# Patient Record
Sex: Male | Born: 1990 | Hispanic: Yes | Marital: Single | Smoking: Never smoker
Health system: Southern US, Community
[De-identification: ages and names within clinical notes are randomized; demographics above are authoritative.]

---

## 2014-06-11 ENCOUNTER — Encounter (HOSPITAL_COMMUNITY): Payer: Self-pay | Admitting: Emergency Medicine

## 2014-06-11 ENCOUNTER — Emergency Department (INDEPENDENT_AMBULATORY_CARE_PROVIDER_SITE_OTHER)
Admission: EM | Admit: 2014-06-11 | Discharge: 2014-06-11 | Disposition: A | Discharge status: Home / Self Care | Payer: Self-pay | Source: Home / Self Care | Attending: Family Medicine | Admitting: Family Medicine

## 2014-06-11 DIAGNOSIS — E860 Dehydration: Secondary | ICD-10-CM

## 2014-06-11 LAB — POCT I-STAT, CHEM 8
BUN: 10 mg/dL (ref 6–23)
CREATININE: 1.2 mg/dL (ref 0.50–1.35)
Calcium, Ion: 1.19 mmol/L (ref 1.12–1.23)
Chloride: 105 mEq/L (ref 96–112)
Glucose, Bld: 92 mg/dL (ref 70–99)
HEMATOCRIT: 50 % (ref 39.0–52.0)
Hemoglobin: 17 g/dL (ref 13.0–17.0)
Potassium: 4.1 mEq/L (ref 3.7–5.3)
SODIUM: 138 meq/L (ref 137–147)
TCO2: 29 mmol/L (ref 0–100)

## 2014-06-11 MED ORDER — ONDANSETRON HCL 4 MG PO TABS: 4.0000 mg | ORAL_TABLET | Freq: Four times a day (QID) | ORAL | Status: AC

## 2014-06-11 NOTE — ED Provider Notes (Signed)
CSN: [id number]     Arrival date & time 06/11/14  1222 History   First MD Initiated Contact with Patient 06/11/14 1225     Chief Complaint  Patient presents with  . Headache   (Consider location/radiation/quality/duration/timing/severity/associated sxs/prior Treatment) Patient is a 23 y.o. male presenting with headaches. The history is provided by the patient.  Headache Pain location:  Frontal Radiates to:  Does not radiate Onset quality:  Gradual Duration:  2 days Progression:  Unchanged Chronicity:  New Similar to prior headaches: no   Context: emotional stress   Context comment:  Works outside as Designer, fashion/clothing in heat Associated symptoms: vomiting   Associated symptoms: no diarrhea, no fever, no nausea, no neck pain and no neck stiffness     History reviewed. No pertinent past medical history. History reviewed. No pertinent past surgical history. History reviewed. No pertinent family history. History  Substance Use Topics  . Smoking status: Never Smoker   . Smokeless tobacco: Not on file  . Alcohol Use: No    Review of Systems  Constitutional: Negative.  Negative for fever.  Gastrointestinal: Positive for vomiting. Negative for nausea, diarrhea and constipation.  Musculoskeletal: Negative for neck pain and neck stiffness.  Skin: Negative for rash.  Neurological: Positive for headaches.    Allergies  Review of patient's allergies indicates no known allergies.  Home Medications   Prior to Admission medications   Medication Sig Start Date End Date Taking? Authorizing Provider  ondansetron (ZOFRAN) 4 MG tablet Take 1 tablet (4 mg total) by mouth every 6 (six) hours. Prn n/v. 06/11/14   Linna Hoff, MD   BP 134/87  Pulse 87  Temp(Src) 97.8 F (36.6 C) (Oral)  Resp 12  SpO2 100% Physical Exam  Nursing note and vitals reviewed. Constitutional: He is oriented to person, place, and time. He appears well-developed and well-nourished.  HENT:  Head: Normocephalic.   Mouth/Throat: Oropharynx is clear and moist.  Neck: Normal range of motion. Neck supple.  Cardiovascular: Normal heart sounds and intact distal pulses.   Pulmonary/Chest: Effort normal and breath sounds normal.  Abdominal: Soft. Bowel sounds are normal. There is no tenderness.  Musculoskeletal: Normal range of motion.  Lymphadenopathy:    He has no cervical adenopathy.  Neurological: He is alert and oriented to person, place, and time.  Skin: Skin is warm and dry. No rash noted.    ED Course  Procedures (including critical care time) Labs Review Labs Reviewed - No data to display i-stat--wnl. Imaging Review No results found.   MDM   1. Dehydration, mild        Linna Hoff, MD 06/11/14 1346

## 2014-06-11 NOTE — ED Notes (Signed)
Pt     Reports   Symptoms  Of  Headache    And      Vomited  X  1        With  Onset       Today   Reports  Some  Body  Aches  As  Well          denys  Any  Diarrhea            He  Is  Sitting  Upright on  The exam  Table  Reports   Some  dizzyness   As  well

## 2015-08-11 ENCOUNTER — Emergency Department (HOSPITAL_COMMUNITY): Admission: EM | Admit: 2015-08-11 | Discharge: 2015-08-11 | Discharge status: Absent without leave | Payer: Self-pay

## 2015-09-30 ENCOUNTER — Emergency Department (HOSPITAL_COMMUNITY): Payer: Self-pay

## 2015-09-30 ENCOUNTER — Emergency Department (HOSPITAL_COMMUNITY)
Admission: EM | Admit: 2015-09-30 | Discharge: 2015-09-30 | Disposition: A | Discharge status: Home / Self Care | Payer: Self-pay | Attending: Emergency Medicine | Admitting: Emergency Medicine

## 2015-09-30 ENCOUNTER — Encounter (HOSPITAL_COMMUNITY): Payer: Self-pay | Admitting: Emergency Medicine

## 2015-09-30 DIAGNOSIS — Y9289 Other specified places as the place of occurrence of the external cause: Secondary | ICD-10-CM | POA: Insufficient documentation

## 2015-09-30 DIAGNOSIS — S92102A Unspecified fracture of left talus, initial encounter for closed fracture: Secondary | ICD-10-CM

## 2015-09-30 DIAGNOSIS — Y998 Other external cause status: Secondary | ICD-10-CM | POA: Insufficient documentation

## 2015-09-30 DIAGNOSIS — S92125A Nondisplaced fracture of body of left talus, initial encounter for closed fracture: Secondary | ICD-10-CM | POA: Insufficient documentation

## 2015-09-30 DIAGNOSIS — Y9389 Activity, other specified: Secondary | ICD-10-CM | POA: Insufficient documentation

## 2015-09-30 MED ORDER — HYDROCODONE-ACETAMINOPHEN 5-325 MG PO TABS
1.0000 | ORAL_TABLET | Freq: Once | ORAL | Status: AC
  Administered 2015-09-30: 1 via ORAL
  Filled 2015-09-30: qty 1

## 2015-09-30 MED ORDER — IBUPROFEN 600 MG PO TABS: 600.0000 mg | ORAL_TABLET | Freq: Four times a day (QID) | ORAL | Status: AC | PRN

## 2015-09-30 MED ORDER — HYDROCODONE-ACETAMINOPHEN 5-325 MG PO TABS: 1.0000 | ORAL_TABLET | ORAL | Status: AC | PRN
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
## 2015-09-30 NOTE — Discharge Instructions (Signed)
Cuidados del yeso o la férula °(Cast or Splint Care) °El yeso y las férulas sostienen los miembros lesionados y evitan que los huesos se muevan hasta que se curen. Es importante que cuide el yeso o la férula cuando se encuentre en su casa.   °INSTRUCCIONES PARA EL CUIDADO EN EL HOGAR °· Mantenga el yeso o la férula al descubierto durante el tiempo de secado. Puede tardar entre 24 y 48 horas para secarse si está hecho de yeso. La fibra de vidrio se seca en menos de 1 hora. °· No apoye el yeso sobre nada que sea más duro que una almohada durante 24 horas. °· No aplique peso sobre el miembro lesionado ni haga presión sobre el yeso hasta que el médico lo autorice. °· Mantenga el yeso o la férula secos. Al mojarse pueden perder la forma y podría ocurrir que no soporten el miembro. Un yeso mojado que ha perdido su forma puede presionar de manera peligrosa en la piel al secarse. Además, la piel mojada podría infectarse. °· Cubra el yeso o la férula con una bolsa plástica cuando tome un baño o cuando salga al exterior en días de lluvia o nieve. Si el yeso está colocado sobre el tronco, deberá bañarse pasando una esponja por el cuerpo, hasta que se lo retiren. °· Si el yeso se moja, séquelo con una toalla o con un secador de cabello sólo en posición de aire frío. °· Mantenga el yeso o la férula limpios. Si el yeso se ensucia, puede limpiarlo con un paño húmedo. °· No coloque objetos extraños duros o blandos debajo del yeso o cabestrillo, como algodón, papel higiénico, loción o talco. °· No se rasque la piel por debajo del molde con ningún objeto. Podría quedar adherido al yeso. Además, el rascado puede causar una infección. Si siente picazón, use un secador de cabello con aire frío sobre la zona que pica para aliviar las molestias. °· No recorte ni quite el relleno acolchado que se encuentra debajo del yeso. °· Ejercite todas las articulaciones que no estén inmovilizadas por el yeso o férula. Por ejemplo, si tiene un yeso  largo de pierna, ejercite la articulación de la cadera y los dedos de los pies. Si tiene un brazo enyesado o entablillado, ejercite el hombro, el codo, el pulgar y los dedos de la mano. °· Eleve el brazo o la pierna sobre 1 ó 2 almohadas durante los primeros 3 días para disminuir la hinchazón y el dolor.Es mejor si puede elevar cómodamente el yeso para que quede más arriba del nivel del corazón. °SOLICITE ATENCIÓN MÉDICA SI:  °· El yeso o la férula se quiebran. °· Siente que el yeso o la férula están muy apretados o muy flojos. °· Tiene una picazón insoportable debajo del yeso. °· El yeso se moja o tiene una zona blanda. °· Siente un feo olor que proviene del interior del yeso. °· Algún objeto se queda atascado bajo el yeso. °· La piel que rodea el yeso enrojece o se vuelve sensible. °· Siente un dolor nuevo o el dolor que sentía empeora luego de la aplicación del yeso. °SOLICITE ATENCIÓN MÉDICA DE INMEDIATO SI:  °· Observa un líquido que sale por el yeso. °· No puede mover el dedo lesionado. °· Los dedos le cambian de color (blancos o azules), siente frío, dolor o por fuera del yeso los dedos están muy inflamados. °· Siente hormigueo o adormecimiento alrededor de la zona de la lesión. °· Siente un dolor o presión intensos debajo del yeso. °·   Presenta dificultad para respirar o Company secretaryle falta el aire.  Siente dolor en el pecho.   Esta informacin no tiene Theme park managercomo fin reemplazar el consejo del mdico. Asegrese de hacerle al mdico cualquier pregunta que tenga.   Document Released: 09/27/2005 Document Revised: 07/18/2013 Elsevier Interactive Patient Education 2016 [employer]  St. Regis FallsFractura de los huesos del tarso con rehabilitacin (Tarsal Fracture With Rehab) Los huesos del tarso son un conjunto de 7 huesos que juntos forman la mitad posterior del pie. Una fractura es la ruptura en uno de esos huesos. Los siete huesos del tarso son el hueso del tobillo (astrgalo), el hueso del taln (calcneo), los cuatro huesos  cuneiformes, el cuboide y el navicular. SNTOMAS  Dolor intenso en el momento de la lesin, que puede persistir durante algunas semanas.  Sensibilidad, inflamacin o hematomas en el sitio de la fractura (contusin).  Hinchazn del pie, que causa adormecimiento y parlisis, que son signos de lesin nerviosa y en los vasos sanguneos (poco frecuentes). CAUSAS La fractura se produce cuando se aplica una fuerza mayor que la que puede soportar el Bethelhueso. Las causas ms frecuentes de una lesin son:  Environmental managerTraumatismo directo (lesin por un impacto) en el pie.  Caer de manera torpe Kimberly-Clarksobre el pie despus de Probation officersaltar.  Torcedura del tobillo y Clorox Companyel pie. LOS RIESGOS AUMENTAN CON:  En los deportes de contacto (ftbol americano, rugby).  Deportes que requieran saltos (bsquet y vley)  Lesiones previas del tobillo o el pie.  Poca fuerza y flexibilidad. PREVENCIN  Precalentamiento adecuado y elongacin antes de la [place]actividad.  Mantener la forma fsica:  Earma ReadingFuerza, flexibilidad y resistencia muscular.  Entrenamiento cardiovascular (aumenta la frecuencia cardaca).  Proteja el tobillo y el pie con Qatarcinta adhesiva, dispositivos ortopdicos o vendajes compresivos.  Use calzado que le ajuste bien y que sea adecuado para el deporte o la Solicitoractividad que practica. PRONSTICO Si se trata adecuadamente, esta fractura puede curarse con tratamiento no quirrgico. En algunos casos es necesario realizar una ciruga para tratar los fragmentos de hueso que estn fuera de alineacin (fractura desplazada). COMPLICACIONES RELACIONADAS  No se cura adecuadamente (ausencia de unin).  Curacin en posicin incorrecta (mala unin).  La recurrencia de los sntomas puede dar como resultado un problema crnico.  Tiempo de curacin prolongado si no se trata adecuadamente o vuelve a lesionarse.  Muerte sea, debido a la circulacin deficiente en el sitio de la fractura (es raro).  Artritis del pie. TRATAMIENTO El  tratamiento inicial consiste en la toma de medicamentos y la aplicacin de hielo para Engineer, materialsaliviar el dolor y reducir la hinchazn. Si los fragmentos del hueso estn fuera de alineacin (fractura desplazada) el hueso debe ser realineado inmediatamente (reduccin) por un profesional entrenado. Las fracturas que no pueden reducirse manualmente o si los huesos salen a travs de la piel (estn abiertas) pueden requerir Neomia Dearuna ciruga para mantener la fractura en su lugar con tornillos, clavos o placas. Una vez que estn correctamente alineados, el pie y el tobillo deben inmovilizarse durante cierto tiempo para permitir la curacin. Luego de la inmovilizacin, es Teacher, early years/preimportante realizar ejercicios de elongacin y fortalecimiento para recobrar la fuerza y la amplitud de movimientos. Los ejercicios pueden Management consultantrealizarse en el hogar o con un terapeuta.  MEDICAMENTOS  Si es necesaria la administracin de medicamentos para Chief Technology Officerel dolor, se recomiendan los antiinflamatorios no esteroides, como aspirina e ibuprofeno y otros calmantes menores, como acetaminofeno.  No tome medicamentos para el dolor dentro de los [address] das previos a la Azerbaijanciruga.  El profesional podr prescribirle  calmantes si lo considera necesario. Utilcelos como se le indique y slo cuando lo necesite. TERAPIA FRA El tratamiento con fro [employer] y reduce la inflamacin. El fro debe aplicarse durante 10 a 15 minutos cada 2  3 horas inmediatamente despus de cualquier [employer] sntomas. Utilice bolsas o un masaje de hielo. SOLICITE ATENCIN MDICA SI:   El tratamiento no es Secretary/administrator, o la afeccin [place].  Algn medicamento le produce [employer].  Tiene alguna complicacin por la [place].  Dolor, adormecimiento o fro [employer].  Cambio del color debajo de las uas (azul o gris) del dedo [place].  Aparecen signos de infeccin (fiebre, dolor, inflamacin, enrojecimiento o hemorragia persistente). Esta informacin no tiene Public house manager el consejo del mdico. Asegrese de hacerle al mdico cualquier pregunta que tenga.   Document Released: 07/14/2006 Document Revised: 02/11/2015 Elsevier Interactive Patient Education [employer]
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
## 2015-10-06 ENCOUNTER — Emergency Department (HOSPITAL_COMMUNITY)
Admission: EM | Admit: 2015-10-06 | Discharge: 2015-10-06 | Disposition: A | Discharge status: Home / Self Care | Payer: Self-pay | Attending: Emergency Medicine | Admitting: Emergency Medicine

## 2015-10-06 ENCOUNTER — Encounter (HOSPITAL_COMMUNITY): Payer: Self-pay | Admitting: *Deleted

## 2015-10-06 DIAGNOSIS — Z79899 Other long term (current) drug therapy: Secondary | ICD-10-CM | POA: Insufficient documentation

## 2015-10-06 DIAGNOSIS — X58XXXD Exposure to other specified factors, subsequent encounter: Secondary | ICD-10-CM | POA: Insufficient documentation

## 2015-10-06 DIAGNOSIS — S9002XD Contusion of left ankle, subsequent encounter: Secondary | ICD-10-CM | POA: Insufficient documentation

## 2015-10-06 DIAGNOSIS — S92192D Other fracture of left talus, subsequent encounter for fracture with routine healing: Secondary | ICD-10-CM | POA: Insufficient documentation

## 2015-10-06 DIAGNOSIS — S92102D Unspecified fracture of left talus, subsequent encounter for fracture with routine healing: Secondary | ICD-10-CM

## 2015-10-06 MED ORDER — HYDROCODONE-ACETAMINOPHEN 5-325 MG PO TABS
1.0000 | ORAL_TABLET | Freq: Once | ORAL | Status: AC
  Administered 2015-10-06: 1 via ORAL
  Filled 2015-10-06: qty 1

## 2015-10-06 MED ORDER — NAPROXEN 500 MG PO TABS: 500.0000 mg | ORAL_TABLET | Freq: Two times a day (BID) | ORAL | Status: AC

## 2015-10-06 NOTE — Progress Notes (Signed)
Orthopedic Tech Progress Note Patient Details:  Charm BargesMarcos Kiang 10/09/1991 [id number]  Ortho Devices Type of Ortho Device: Ace wrap, Post (short leg) splint Ortho Device/Splint Location: LLE Ortho Device/Splint Interventions: Ordered, Application   Jennye MoccasinHughes, Gonzalo Waymire Craig 10/06/2015, 2:37 PM

## 2015-10-06 NOTE — ED Provider Notes (Signed)
CSN: [id number]     Arrival date & time 10/06/15  1305 History  By signing my name below, I, Eric Neal, attest that this documentation has been prepared under the direction and in the presence of [employer], PA-C.  Electronically Signed: Doreatha Neal, ED Scribe. 10/06/2015. 2:21 PM.     Chief Complaint  Patient presents with  . Leg Pain   The history is provided by the patient. No language interpreter was used.    HPI Comments: Glenmore Karl is a 24 y.o. male who presents to the Emergency Department complaining of moderate, persistent, worsening left ankle pain onset 6 days ago. Pt was seen for the same pain on 09/30/15 after his initial injury and had XR showing a talus fracture. Pt has not yet followed up with orthopedics and has not made an appointment. Pt states he has been taking pain medication with mild to moderate relief. He denies numbness, paresthesia, fever, focal weakness. He also denies re-injury or new trauma to the ankle.   History reviewed. No pertinent past medical history. History reviewed. No pertinent past surgical history. History reviewed. No pertinent family history. Social History  Substance Use Topics  . Smoking status: Never Smoker   . Smokeless tobacco: None  . Alcohol Use: No    Review of Systems  Constitutional: Negative for fever.  Musculoskeletal: Positive for arthralgias.  Neurological: Negative for weakness and numbness.  All other systems reviewed and are negative.  Allergies  Review of patient's allergies indicates no known allergies.  Home Medications   Prior to Admission medications   Medication Sig Start Date End Date Taking? Authorizing Provider  HYDROcodone-acetaminophen (NORCO/VICODIN) 5-325 MG tablet Take 1 tablet by mouth every 4 (four) hours as needed. 09/30/15   Burgess Amor, PA-C  ibuprofen (ADVIL,MOTRIN) 600 MG tablet Take 1 tablet (600 mg total) by mouth every 6 (six) hours as needed. 09/30/15   Burgess Amor,  PA-C  ondansetron (ZOFRAN) 4 MG tablet Take 1 tablet (4 mg total) by mouth every 6 (six) hours. Prn n/v. 06/11/14   Linna Hoff, MD   BP 141/70 mmHg  Pulse 68  Temp(Src) 98.1 F (36.7 C) (Oral)  Resp 18  Ht  (1.676 m)  Wt 220 lb (99.791 kg)  BMI 35.53 kg/m2  SpO2 100% Physical Exam  Constitutional: He is oriented to person, place, and time. He appears well-developed and well-nourished. No distress.  HENT:  Head: Normocephalic and atraumatic.  Eyes: Conjunctivae are normal. Right eye exhibits no discharge. Left eye exhibits no discharge. No scleral icterus.  Cardiovascular: Normal rate and intact distal pulses.   Pulmonary/Chest: Effort normal.  Musculoskeletal:       Left ankle: He exhibits swelling and ecchymosis. He exhibits no deformity and normal pulse. Tenderness. Lateral malleolus tenderness found. No proximal fibula tenderness found. Achilles tendon normal.  Neurological: He is alert and oriented to person, place, and time. Coordination normal.  Strength 5/5 throughout. No sensory deficits.    Skin: Skin is warm and dry. No rash noted. He is not diaphoretic. No erythema. No pallor.  Psychiatric: He has a normal mood and affect. His behavior is normal.  Nursing note and vitals reviewed.   ED Course  Procedures (including critical care time) DIAGNOSTIC STUDIES: Oxygen Saturation is 100% on RA, normal by my interpretation.    COORDINATION OF CARE: 2:19 PM Discussed treatment plan with pt at bedside which includes new splint application and pt agreed to plan.   MDM   Final  diagnoses:  Fracture, talus, left, with routine healing, subsequent encounter   Patient X-Ray from 09/30/15 showed Nondisplaced fracture of an area of bony overgrowth along the lateral aspect of the talus. Pt advised again to follow up with orthopedics. Removed posterior splint applied on 09/30/15 and applied a new posterior splint while in the ED. Conservative therapy recommended and discussed.  Patient will be discharged home & is agreeable with above plan. Returns precautions discussed. Pt appears safe for discharge.   I personally performed the services described in this documentation, which was scribed in my presence. The recorded information has been reviewed and is accurate.    Lester KinsmanSamantha Tripp LenoirDowless, PA-C 10/06/15 1647  Linwood DibblesJon Knapp, MD 10/07/15 [phone]

## 2015-10-06 NOTE — ED Notes (Signed)
Pt presents w/ left foot/ankle pain that was worse this morning - pt was seen on 12/20 and dx w/ a talus fx.Pt states he has not followed up with ortho yet, splint in place.

## 2015-10-06 NOTE — Discharge Instructions (Signed)
Follow up with orthopedic specialist as soon as possible for re-evaluation. Apply ice to affected area. Do not weight bear. Take naprosyn as needed for pain. Keep leg elevated Return to the ED if you experience increased swelling, chest pain, difficulty breathing, numbness/weakness in your extremity.

## 2015-10-30 ENCOUNTER — Ambulatory Visit: Payer: Self-pay | Admitting: Internal Medicine

## 2017-03-23 ENCOUNTER — Emergency Department (HOSPITAL_COMMUNITY): Payer: Self-pay

## 2017-03-23 ENCOUNTER — Encounter (HOSPITAL_COMMUNITY): Payer: Self-pay | Admitting: Emergency Medicine

## 2017-03-23 ENCOUNTER — Other Ambulatory Visit: Payer: Self-pay

## 2017-03-23 DIAGNOSIS — R002 Palpitations: Secondary | ICD-10-CM | POA: Insufficient documentation

## 2017-03-23 DIAGNOSIS — R110 Nausea: Secondary | ICD-10-CM | POA: Insufficient documentation

## 2017-03-23 DIAGNOSIS — R0789 Other chest pain: Secondary | ICD-10-CM | POA: Insufficient documentation

## 2017-03-23 LAB — BASIC METABOLIC PANEL
Anion gap: 7 (ref 5–15)
BUN: 15 mg/dL (ref 6–20)
CO2: 26 mmol/L (ref 22–32)
Calcium: 8.7 mg/dL — ABNORMAL LOW (ref 8.9–10.3)
Chloride: 105 mmol/L (ref 101–111)
Creatinine, Ser: 1.05 mg/dL (ref 0.61–1.24)
GFR calc Af Amer: >60 mL/min (ref >60)
Glucose, Bld: 108 mg/dL — ABNORMAL HIGH (ref 65–99)
Potassium: 3.5 mmol/L (ref 3.5–5.1)
Sodium: 138 mmol/L (ref 135–145)

## 2017-03-23 LAB — I-STAT TROPONIN, ED: Troponin i, poc: 0 ng/mL (ref 0.00–0.08)

## 2017-03-23 LAB — CBC
HCT: 46.5 % (ref 39.0–52.0)
Hemoglobin: 15.3 g/dL (ref 13.0–17.0)
MCH: 28.7 pg (ref 26.0–34.0)
MCHC: 32.9 g/dL (ref 30.0–36.0)
MCV: 87.2 fL (ref 78.0–100.0)
PLATELETS: 195 10*3/uL (ref 150–400)
RBC: 5.33 MIL/uL (ref 4.22–5.81)
RDW: 13.7 % (ref 11.5–15.5)
WBC: 7.5 10*3/uL (ref 4.0–10.5)

## 2017-03-23 NOTE — ED Triage Notes (Signed)
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

## 2017-03-24 ENCOUNTER — Emergency Department (HOSPITAL_COMMUNITY)
Admission: EM | Admit: 2017-03-24 | Discharge: 2017-03-24 | Disposition: A | Discharge status: Home / Self Care | Payer: Self-pay | Attending: Emergency Medicine | Admitting: Emergency Medicine

## 2017-03-24 DIAGNOSIS — R0789 Other chest pain: Secondary | ICD-10-CM

## 2017-03-24 NOTE — ED Provider Notes (Signed)
[facility]-EMERGENCY DEPT Provider Note   CSN: [id number] Arrival date & time: 03/23/17  2246   By signing my name below, I, Freida Busmaniana Omoyeni, attest that this documentation has been prepared under the direction and in the presence of Melene PlanFloyd, Kaydyn Sayas, DO . Electronically Signed: Freida Busmaniana Omoyeni, Scribe. 03/24/2017. 4:00 AM.  History   Chief Complaint Chief Complaint  Patient presents with  . Chest Pain  . Palpitations  . Nausea    The history is provided by the patient. No language interpreter was used.  Chest Pain   This is a new problem. The current episode started more than 2 days ago. The pain is moderate. Associated symptoms include nausea and vomiting. Pertinent negatives include no abdominal pain, no fever, no headaches, no palpitations and no shortness of breath. Risk factors include male gender.     HPI Comments:  Eric Neal is a 26 y.o. male with no significant PMHx, who presents to the Emergency Department complaining of moderate, central CP x 3 days. He notes his pain began at work; he is a Designer, fashion/clothingroofer. Pain worsened today after work.His pain radiates into his upper chest/neck region.  He reports associated nausea, and vomiting. No SOB, fever, leg swelling or hemoptysis. No smoking. No h/o CA, or PE/DVT. No FHx of MI. No alleviating factors noted.   History reviewed. No pertinent past medical history.  There are no active problems to display for this patient.   History reviewed. No pertinent surgical history.     Home Medications    Prior to Admission medications   Medication Sig Start Date End Date Taking? Authorizing Provider  HYDROcodone-acetaminophen (NORCO/VICODIN) 5-325 MG tablet Take 1 tablet by mouth every 4 (four) hours as needed. 09/30/15   Burgess AmorIdol, Julie, PA-C  ibuprofen (ADVIL,MOTRIN) 600 MG tablet Take 1 tablet (600 mg total) by mouth every 6 (six) hours as needed. 09/30/15   Burgess AmorIdol, Julie, PA-C  naproxen (NAPROSYN) 500 MG tablet Take 1 tablet (500 mg total) by  mouth 2 (two) times daily. 10/06/15   Dowless, Samantha Tripp, PA-C  ondansetron (ZOFRAN) 4 MG tablet Take 1 tablet (4 mg total) by mouth every 6 (six) hours. Prn n/v. 06/11/14   Linna HoffKindl, James D, MD    Family History History reviewed. No pertinent family history.  Social History Social History  Substance Use Topics  . Smoking status: Never Smoker  . Smokeless tobacco: Not on file  . Alcohol use No     Comment: occassionally     Allergies   Patient has no known allergies.   Review of Systems Review of Systems  Constitutional: Negative for chills and fever.  HENT: Negative for congestion and facial swelling.   Eyes: Negative for discharge and visual disturbance.  Respiratory: Negative for shortness of breath.   Cardiovascular: Positive for chest pain. Negative for palpitations.  Gastrointestinal: Positive for nausea and vomiting. Negative for abdominal pain and diarrhea.  Musculoskeletal: Negative for arthralgias and myalgias.  Skin: Negative for color change and rash.  Neurological: Negative for tremors, syncope and headaches.  Psychiatric/Behavioral: Negative for confusion and dysphoric mood.     Physical Exam Updated Vital Signs BP (!) 147/89 (BP Location: Right Arm)   Pulse 61   Temp 98.5 F (36.9 C)   Resp (!) 22   Ht 5\' 11"  (1.803 m)   Wt 90.7 kg (200 lb)   SpO2 99%   BMI 27.89 kg/m   Physical Exam  Constitutional: He is oriented to person, place, and time. He appears well-developed and  well-nourished.  HENT:  Head: Normocephalic and atraumatic.  Eyes: EOM are normal. Pupils are equal, round, and reactive to light.  Neck: Normal range of motion. Neck supple. No JVD present.  Cardiovascular: Normal rate and regular rhythm.  Exam reveals no gallop and no friction rub.   No murmur heard. Pulmonary/Chest: No respiratory distress. He has no wheezes.  Abdominal: He exhibits no distension. There is no rebound and no guarding.  Musculoskeletal: Normal range of  motion.  Neurological: He is alert and oriented to person, place, and time.  Skin: No rash noted. No pallor.  Psychiatric: He has a normal mood and affect. His behavior is normal.  Nursing note and vitals reviewed.    ED Treatments / Results  DIAGNOSTIC STUDIES:  Oxygen Saturation is 99% on RA, normal by my interpretation.    COORDINATION OF CARE:  4:00 AM Discussed treatment plan with pt at bedside and pt agreed to plan.  Labs (all labs ordered are listed, but only abnormal results are displayed) Labs Reviewed  BASIC METABOLIC PANEL - Abnormal; Notable for the following:       Result Value   Glucose, Bld 108 (*)    Calcium 8.7 (*)    All other components within normal limits  CBC  I-STAT TROPOININ, ED    EKG  EKG Interpretation  Date/Time:  Wednesday March 23 2017 22:51:52 EDT Ventricular Rate:  76 PR Interval:  126 QRS Duration: 88 QT Interval:  392 QTC Calculation: 441 R Axis:   63 Text Interpretation:  Normal sinus rhythm Cannot rule out Inferior infarct , age undetermined Abnormal ECG No old tracing to compare Confirmed by Melene Plan [phone]) on 03/24/2017 3:26:00 AM       Radiology Dg Chest 2 View  Result Date: 03/23/2017 CLINICAL DATA:  Acute onset of generalized chest pain. Initial encounter. EXAM: CHEST  2 VIEW COMPARISON:  None. FINDINGS: The lungs are well-aerated and clear. There is no evidence of focal opacification, pleural effusion or pneumothorax. The heart is normal in size; the mediastinal contour is within normal limits. No acute osseous abnormalities are seen. IMPRESSION: No acute cardiopulmonary process seen. Electronically Signed   By: Roanna Raider M.D.   On: 03/23/2017 23:14    Procedures Procedures (including critical care time)  Medications Ordered in ED Medications - No data to display   Initial Impression / Assessment and Plan / ED Course  I have reviewed the triage vital signs and the nursing notes.  Pertinent labs & imaging  results that were available during my care of the patient were reviewed by me and considered in my medical decision making (see chart for details).     26 yo M with a chief complaint of chest pain. Radiates up to his neck. Going on for the past 3 days or so. Nonexertional. Well-appearing and nontoxic. Likely reflux by history. Completely atypical from ACS. I do not feel that troponins are warranted. Start on Zantac. PCP follow-up.  4:14 AM:  I have discussed the diagnosis/risks/treatment options with the patient and believe the pt to be eligible for discharge home to follow-up with PCP. We also discussed returning to the ED immediately if new or worsening sx occur. We discussed the sx which are most concerning (e.g., sudden worsening pain, sob, exertional symptoms) that necessitate immediate return. Medications administered to the patient during their visit and any new prescriptions provided to the patient are listed below.  Medications given during this visit Medications - No data to display  The patient appears reasonably screen and/or stabilized for discharge and I doubt any other medical condition or other [facility] requiring further screening, evaluation, or treatment in the ED at this time prior to discharge.    Final Clinical Impressions(s) / ED Diagnoses   Final diagnoses:  Atypical chest pain    New Prescriptions New Prescriptions   No medications on file    I personally performed the services described in this documentation, which was scribed in my presence. The recorded information has been reviewed and is accurate.     Melene Plan, DO 03/24/17 [phone]

## 2017-03-24 NOTE — Discharge Instructions (Signed)
Try zantac 150mg twice a day.  ° ° °

## 2018-02-21 IMAGING — DX DG CHEST 2V
2 series · 2 of 2 positions shown · non-contrast
Comparison: None.

CLINICAL DATA: Acute onset of generalized chest pain. Initial
encounter.

EXAM:
CHEST  2 VIEW

[chest pa]
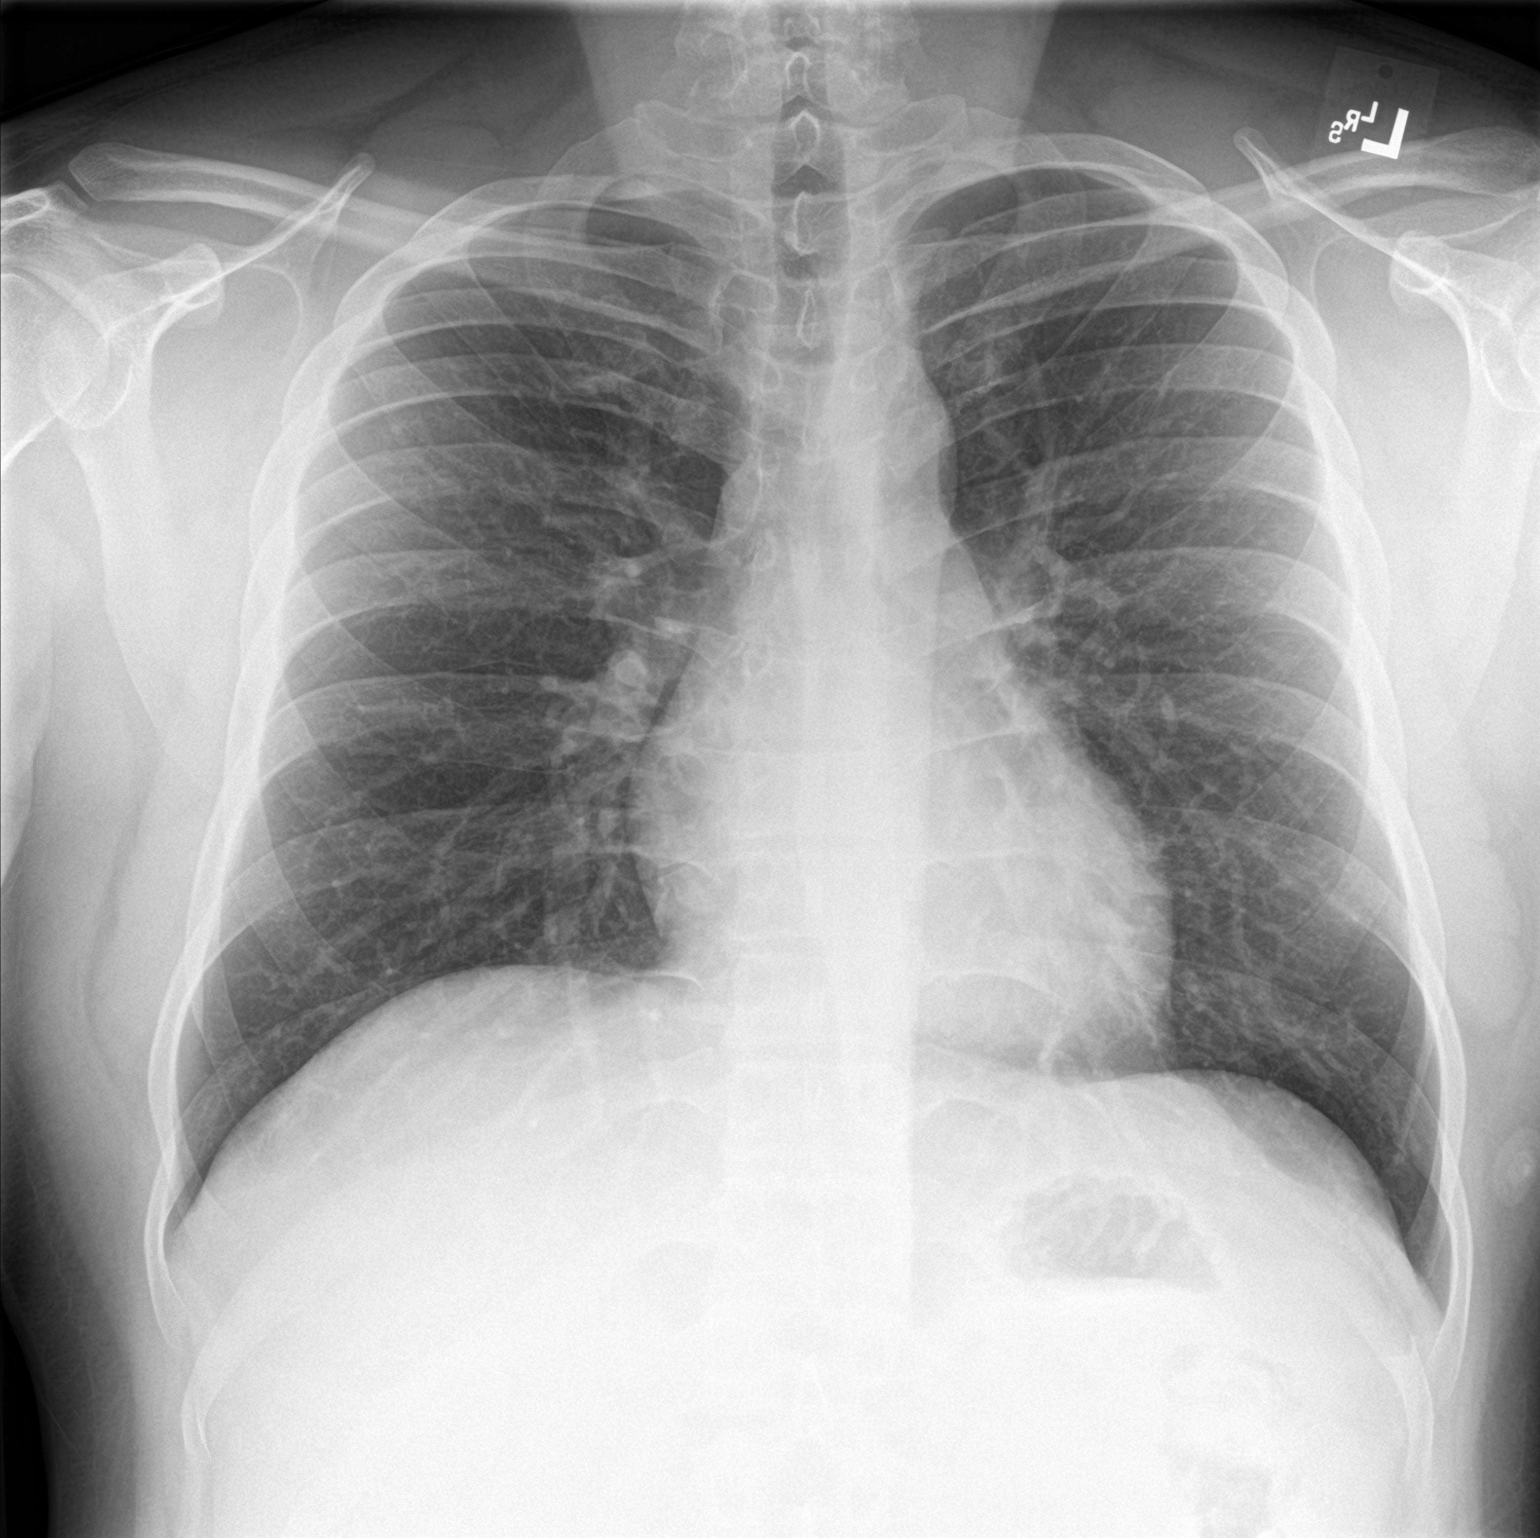

[chest lat]
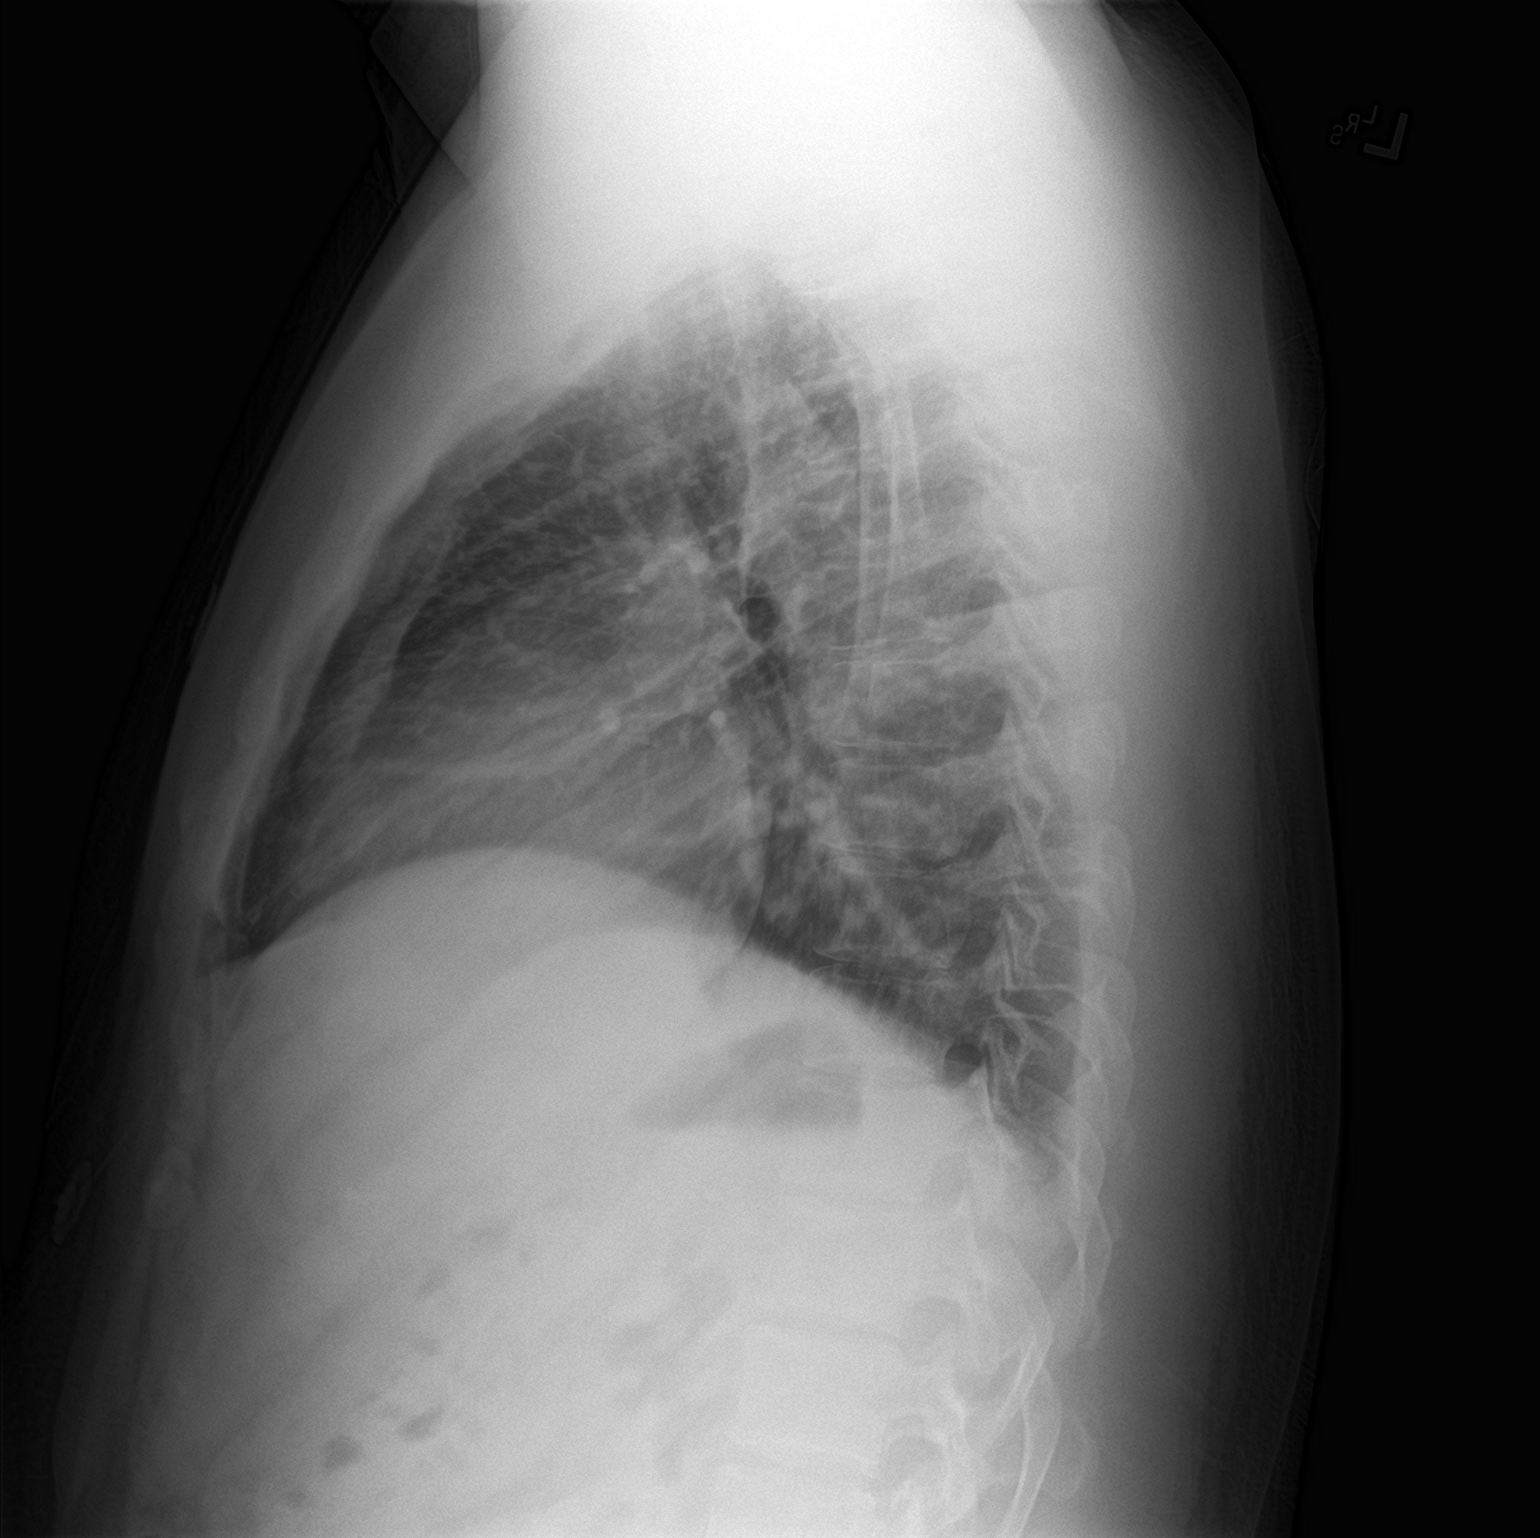

[2 of 2 positions shown; findings below may reference images not displayed]

FINDINGS: The lungs are well-aerated and clear. There is no evidence of focal
opacification, pleural effusion or pneumothorax.

The heart is normal in size; the mediastinal contour is within
normal limits. No acute osseous abnormalities are seen.
IMPRESSION: No acute cardiopulmonary process seen.

## 2019-07-08 ENCOUNTER — Other Ambulatory Visit: Payer: Self-pay

## 2019-07-08 ENCOUNTER — Encounter (HOSPITAL_COMMUNITY): Payer: Self-pay | Admitting: Emergency Medicine

## 2019-07-08 DIAGNOSIS — Z5321 Procedure and treatment not carried out due to patient leaving prior to being seen by health care provider: Secondary | ICD-10-CM | POA: Insufficient documentation

## 2019-07-08 DIAGNOSIS — R05 Cough: Secondary | ICD-10-CM | POA: Insufficient documentation

## 2019-07-08 NOTE — ED Triage Notes (Signed)
Pt c/o of fever, cough, sinus congestion x3 days. Unsure of any exposure to anyone who is sick.

## 2019-07-09 ENCOUNTER — Emergency Department (HOSPITAL_COMMUNITY)
Admission: EM | Admit: 2019-07-09 | Discharge: 2019-07-09 | Discharge status: Against Medical Advice | Payer: Self-pay | Attending: Emergency Medicine | Admitting: Emergency Medicine

## 2019-07-09 NOTE — ED Notes (Signed)
Update announcement made in lobby that rooms are full, reassured staff is working hard to get pt back as soon as bed is available.   

## 2019-07-10 ENCOUNTER — Other Ambulatory Visit: Payer: Self-pay | Admitting: *Deleted

## 2019-07-10 DIAGNOSIS — Z20822 Contact with and (suspected) exposure to covid-19: Secondary | ICD-10-CM

## 2019-07-11 LAB — NOVEL CORONAVIRUS, NAA: SARS-CoV-2, NAA: NOT DETECTED

## 2019-07-12 ENCOUNTER — Telehealth: Payer: Self-pay | Admitting: *Deleted

## 2019-07-12 NOTE — Telephone Encounter (Signed)
Pt returning call for covid results; negative, verbalizes understanding.
